# Patient Record
Sex: Male | Born: 1970 | Hispanic: No | Marital: Married | Smoking: Never smoker
Health system: Southern US, Community
[De-identification: ages and names within clinical notes are randomized; demographics above are authoritative.]

## PROBLEM LIST (undated history)

## (undated) DIAGNOSIS — A159 Respiratory tuberculosis unspecified: Secondary | ICD-10-CM

## (undated) HISTORY — DX: Respiratory tuberculosis unspecified: A15.9

---

## 2003-02-04 ENCOUNTER — Encounter: Admission: RE | Admit: 2003-02-04 | Discharge: 2003-02-04 | Payer: Self-pay | Admitting: Chiropractic Medicine

## 2005-12-31 ENCOUNTER — Ambulatory Visit: Payer: Self-pay | Admitting: General Practice

## 2014-06-04 ENCOUNTER — Ambulatory Visit (INDEPENDENT_AMBULATORY_CARE_PROVIDER_SITE_OTHER): Payer: BLUE CROSS/BLUE SHIELD

## 2014-06-04 ENCOUNTER — Ambulatory Visit (INDEPENDENT_AMBULATORY_CARE_PROVIDER_SITE_OTHER): Payer: BLUE CROSS/BLUE SHIELD | Admitting: Family Medicine

## 2014-06-04 VITALS — BP 100/70 | HR 99 | Temp 100.0°F | Ht 65.5 in | Wt 148.4 lb

## 2014-06-04 DIAGNOSIS — J329 Chronic sinusitis, unspecified: Secondary | ICD-10-CM | POA: Diagnosis not present

## 2014-06-04 DIAGNOSIS — J8410 Pulmonary fibrosis, unspecified: Secondary | ICD-10-CM | POA: Diagnosis not present

## 2014-06-04 DIAGNOSIS — R05 Cough: Secondary | ICD-10-CM

## 2014-06-04 DIAGNOSIS — R509 Fever, unspecified: Secondary | ICD-10-CM | POA: Diagnosis not present

## 2014-06-04 DIAGNOSIS — R059 Cough, unspecified: Secondary | ICD-10-CM

## 2014-06-04 LAB — POCT INFLUENZA A/B
Influenza A, POC: NEGATIVE
Influenza B, POC: NEGATIVE

## 2014-06-04 MED ORDER — AZITHROMYCIN 250 MG PO TABS: ORAL_TABLET | ORAL | Status: DC

## 2014-06-04 NOTE — Patient Instructions (Signed)
Take the Azithromycin 2 tabs the first day then 1 tab po daily x 4 days.  It will be 5 days total.  You should start feeling better in a few days.  If not, come back and see us.  If you're feeling worse, come back sooner.  It was good to meet you today.

## 2014-06-04 NOTE — Progress Notes (Addendum)
Nathaniel Blevins is a 44 y.o. male who presents to Urgent Care today with complaints of fever and cough and rhinorrhea.  1.  Fever and cough:  Biggest symptom is sinus pressure and congestion.  Also with rhinorrhea.  Clear drainage, occasionally purulent.  Dry cough, worse at night.  Fevers and chills, Fever measured at 103 at work today when he went to see nurse.  Sent home and recommended to come to be evaluated.    No nausea, eating and drinking well.  No chest pain.   Did not receive flu shot this year.   PMH reviewed.  Past Medical History  Diagnosis Date  . Tuberculosis    No past surgical history on file.  Medications reviewed. Current Outpatient Prescriptions  Medication Sig Dispense Refill  . ibuprofen (ADVIL,MOTRIN) 200 MG tablet Take 200 mg by mouth daily as needed.     No current facility-administered medications for this visit.    ROS as above otherwise neg.  No chest pain, palpitations, SOB, Fever, Chills, Abd pain, N/V/D.   Physical Exam:  BP 100/70 mmHg  Pulse 99  Temp(Src) 100 F (37.8 C) (Oral)  Ht 5' 5.5" (1.664 m)  Wt 148 lb 6 oz (67.302 kg)  BMI 24.31 kg/m2  SpO2 94% Gen:  Patient sitting on exam table, appears stated age in no acute distress Head: Normocephalic atraumatic Eyes: EOMI, PERRL, sclera and conjunctiva non-erythematous Ears:  Canals clear bilaterally.  TMs pearly gray bilaterally without erythema or bulging.   Nose:  Nasal turbinates grossly enlarged bilaterally. Some exudates noted. Tender to palpation of maxillary sinus  Mouth: Mucosa membranes moist. Tonsils +2, nonenlarged, non-erythematous. Neck: No cervical lymphadenopathy noted Heart:  RRR, no murmurs auscultated. Pulm:  Clear to auscultation bilaterally with good air movement.  No wheezes or rales noted.     [facility] reading (PRIMARY) by  Dr. Gwendolyn GrantWalden:  Odd xray, with tracheal deviation to Left.  No evidence of acute cardiopulmonary disease.  Does have small LEFT pleural  effusion.   Assessment and Plan:  1.  Sinus infection: - Treat with Azithromycin x 5 days - Negative CXR for CAP.  Negative flu swab. - If not improvement in 3-4 days, should FU with us - FU sooner if worsening.   - Should be better within next 3-4 days.  If no better, return.  Sooner if worsening.    **Addendum:  Please see CXR radiology read.  Needs CT of chest.  Concern for fibrosis versus malignancy.  Will put in order now.  Have instructed staff to schedule CT and have patient return in 2-3 days to assess for improvement and next steps pending CT chest.  If fever still present despite abx, may need to think of TB or other infectious causes.

## 2014-06-05 NOTE — Addendum Note (Signed)
Addended by: Tobey GrimWALDEN, Trayson Stitely H on: 06/05/2014 09:54 AM   Modules accepted: Orders, SmartSet

## 2014-06-05 NOTE — Addendum Note (Signed)
Addended byGwendolyn Grant: Jaevian Shean H on: 06/05/2014 07:51 AM   Modules accepted: Kipp BroodSmartSet

## 2014-06-09 ENCOUNTER — Ambulatory Visit (INDEPENDENT_AMBULATORY_CARE_PROVIDER_SITE_OTHER): Payer: BLUE CROSS/BLUE SHIELD | Admitting: Physician Assistant

## 2014-06-09 ENCOUNTER — Ambulatory Visit: Payer: BLUE CROSS/BLUE SHIELD

## 2014-06-09 VITALS — BP 110/66 | HR 82 | Temp 97.9°F | Resp 18 | Ht 65.0 in | Wt 143.0 lb

## 2014-06-09 DIAGNOSIS — Z8611 Personal history of tuberculosis: Secondary | ICD-10-CM | POA: Diagnosis not present

## 2014-06-09 DIAGNOSIS — R918 Other nonspecific abnormal finding of lung field: Secondary | ICD-10-CM | POA: Diagnosis not present

## 2014-06-09 DIAGNOSIS — R059 Cough, unspecified: Secondary | ICD-10-CM

## 2014-06-09 DIAGNOSIS — D509 Iron deficiency anemia, unspecified: Secondary | ICD-10-CM

## 2014-06-09 DIAGNOSIS — R05 Cough: Secondary | ICD-10-CM | POA: Diagnosis not present

## 2014-06-09 DIAGNOSIS — R938 Abnormal findings on diagnostic imaging of other specified body structures: Secondary | ICD-10-CM

## 2014-06-09 LAB — POCT CBC
Granulocyte percent: 61.4 %G (ref 37–80)
HCT, POC: 41.4 % — AB (ref 43.5–53.7)
Hemoglobin: 12.8 g/dL — AB (ref 14.1–18.1)
Lymph, poc: 3.1 (ref 0.6–3.4)
MCH, POC: 20.5 pg — AB (ref 27–31.2)
MCHC: 30.9 g/dL — AB (ref 31.8–35.4)
MCV: 66.4 fL — AB (ref 80–97)
MID (cbc): 0.4 (ref 0–0.9)
MPV: 7.6 fL (ref 0–99.8)
POC Granulocyte: 5.5 (ref 2–6.9)
POC LYMPH PERCENT: 34.1 %L (ref 10–50)
POC MID %: 4.5 %M (ref 0–12)
Platelet Count, POC: 410 10*3/uL (ref 142–424)
RBC: 6.24 M/uL — AB (ref 4.69–6.13)
RDW, POC: 15.7 %
WBC: 9 10*3/uL (ref 4.6–10.2)

## 2014-06-09 MED ORDER — FERROUS SULFATE 325 (65 FE) MG PO TABS: 325.0000 mg | ORAL_TABLET | Freq: Two times a day (BID) | ORAL | Status: DC

## 2014-06-09 MED ORDER — HYDROCODONE-HOMATROPINE 5-1.5 MG/5ML PO SYRP: 5.0000 mL | ORAL_SOLUTION | Freq: Three times a day (TID) | ORAL | Status: DC | PRN

## 2014-06-10 ENCOUNTER — Telehealth: Payer: Self-pay | Admitting: Radiology

## 2014-06-10 ENCOUNTER — Ambulatory Visit
Admission: RE | Admit: 2014-06-10 | Discharge: 2014-06-10 | Disposition: A | Discharge status: Home / Self Care | Payer: BLUE CROSS/BLUE SHIELD | Source: Ambulatory Visit | Attending: Physician Assistant | Admitting: Physician Assistant

## 2014-06-10 DIAGNOSIS — R918 Other nonspecific abnormal finding of lung field: Secondary | ICD-10-CM | POA: Insufficient documentation

## 2014-06-10 DIAGNOSIS — D509 Iron deficiency anemia, unspecified: Secondary | ICD-10-CM

## 2014-06-10 DIAGNOSIS — R059 Cough, unspecified: Secondary | ICD-10-CM

## 2014-06-10 DIAGNOSIS — R05 Cough: Secondary | ICD-10-CM

## 2014-06-10 DIAGNOSIS — Z8611 Personal history of tuberculosis: Secondary | ICD-10-CM | POA: Insufficient documentation

## 2014-06-10 LAB — COMPREHENSIVE METABOLIC PANEL
ALT: 31 U/L (ref 0–53)
AST: 22 U/L (ref 0–37)
Albumin: 4.3 g/dL (ref 3.5–5.2)
Alkaline Phosphatase: 61 U/L (ref 39–117)
BUN: 20 mg/dL (ref 6–23)
CO2: 27 mEq/L (ref 19–32)
Calcium: 9.3 mg/dL (ref 8.4–10.5)
Chloride: 102 mEq/L (ref 96–112)
Creat: 0.79 mg/dL (ref 0.50–1.35)
Glucose, Bld: 92 mg/dL (ref 70–99)
Potassium: 4.5 mEq/L (ref 3.5–5.3)
Sodium: 137 mEq/L (ref 135–145)
Total Bilirubin: 1.1 mg/dL (ref 0.2–1.2)
Total Protein: 7.7 g/dL (ref 6.0–8.3)

## 2014-06-10 MED ORDER — IOHEXOL 300 MG/ML  SOLN
75.0000 mL | Freq: Once | INTRAMUSCULAR | Status: AC | PRN
  Administered 2014-06-10: 75 mL via INTRAVENOUS

## 2014-06-10 NOTE — Telephone Encounter (Signed)
[facility] Called pt's wife and gave her information.

## 2014-06-10 NOTE — Progress Notes (Signed)
06/10/2014 at 10:18 AM  Nathaniel Blevins / DOB: 10-12-1970 / MRN: [id number]  The patient  does not have a problem list on file.  SUBJECTIVE  Chief complaint: Follow-up  Nathaniel Blevins is a 44 y.o. asian [place] male here today for follow up.  He was placed on Z-pak five days ago for a sinus infection and is now complaining of lingering cough that is worse at night.  He received a chest radiograph during his evaluation 5 days ago and was told by [facility] staff to return to discuss the results.    Results of chest radiograph discussed with patient.  Patient has a history of tuberculosis and was told in the past that he has "some scarring" in his lungs. 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  He does not wear a mask during his work.  He is a never smoker. He reports DOE that started during this most recent cough.  He denies chest pain, pressure, nausea, diaphoresis, and presyncope with the DOE.  He is ammenable to proceeding with the chest CT to further define the new nodules found on radiograph. He denies weight loss.    He  has a past medical history of Tuberculosis.    Medications reviewed and updated by myself where necessary, and exist elsewhere in the encounter.   Nathaniel Blevins has No Known Allergies. He  reports that he has never smoked. He does not have any smokeless tobacco history on file. He reports that he drinks alcohol. He reports that he does not use illicit drugs. He  has no sexual activity history on file. The patient  has no past surgical history on file.  His family history is not on file.  ROS  Per HPI  OBJECTIVE  His  height is  (1.651 m) and weight is 143 lb (64.864 kg). His oral temperature is 97.9 F (36.6 C). His blood pressure is 110/66 and his pulse is 82. His respiration is 18 and oxygen saturation is 92%.  The patient's body mass index is 23.8 kg/(m^2).  Physical Exam    Constitutional: He is oriented to person, place, and time. He appears well-developed and well-nourished. No distress.  HENT:  Right Ear: Hearing, tympanic membrane, external ear and ear canal normal.  Left Ear: Hearing, tympanic membrane, external ear and ear canal normal.  Nose: No sinus tenderness. Right sinus exhibits no maxillary sinus tenderness and no frontal sinus tenderness. Left sinus exhibits no maxillary sinus tenderness and no frontal sinus tenderness.  Mouth/Throat: Uvula is midline and mucous membranes are normal. Mucous membranes are not pale, not dry and not cyanotic. No oropharyngeal exudate, posterior oropharyngeal edema, posterior oropharyngeal erythema or tonsillar abscesses.  Cardiovascular: Normal rate and regular rhythm.   Respiratory: Effort normal and breath sounds normal. He has no wheezes. He has no rales.  GI: Soft. Bowel sounds are normal.  Musculoskeletal: Normal range of motion.  Lymphadenopathy:    He has no cervical adenopathy.  Neurological: He is alert and oriented to person, place, and time.  Skin: Skin is warm and dry. He is not diaphoretic.  Psychiatric: He has a normal mood and affect.    Results for orders placed or performed in visit on 06/09/14 (from the past 24 hour(s))  POCT CBC     Status: Abnormal   Collection Time: 06/09/14  9:16 PM  Result Value Ref Range  WBC 9.0 4.6 - 10.2 K/uL   Lymph, poc 3.1 0.6 - 3.4   POC LYMPH PERCENT 34.1 10 - 50 %L   MID (cbc) 0.4 0 - 0.9   POC MID % 4.5 0 - 12 %M   POC Granulocyte 5.5 2 - 6.9   Granulocyte percent 61.4 37 - 80 %G   RBC 6.24 (A) 4.69 - 6.13 M/uL   Hemoglobin 12.8 (A) 14.1 - 18.1 g/dL   HCT, POC 09.841.4 (A) 11.943.5 - 53.7 %   MCV 66.4 (A) 80 - 97 fL   MCH, POC 20.5 (A) 27 - 31.2 pg   MCHC 30.9 (A) 31.8 - 35.4 g/dL   RDW, POC 14.715.7 %   Platelet Count, POC 410 142 - 424 K/uL   MPV 7.6 0 - 99.8 fL    ASSESSMENT & PLAN  [name] was seen today for follow-up.  Diagnoses and all orders for this  visit:  Abnormal chest x-ray with multiple lung nodules: Patient with new nodules and will proceed with chest CT with contrast per radiologist recs.   Patient also with microcytic anemia on CBC.  He denies weight loss but has lost roughly 5 lbs since his initial visit 5 days ago, further causing concern for malignancy.   Orders: -     Comprehensive metabolic panel -     POCT CBC -     CT Chest W Contrast; Future  Coughing Orders: -     CT Chest W Contrast; Future       -     HYDROcodone-homatropine (HYCODAN) 5-1.5 MG/5ML syrup; Take 5 mLs by            mouth every 8 (eight) hours as needed for cough.  Microcytic anemia Orders: -     ferrous sulfate (FERROUSUL) 325 (65 FE) MG tablet; Take 1 tablet (325 mg total) by mouth 2 (two) times daily with a meal. -     CT Chest W Contrast; Future       The patient was advised to call or come back to clinic if he does not see an improvement in symptoms, or worsens with the above plan.   Deliah BostonMichael Ronin Crager, MHS, PA-C [facility] 06/10/2014 10:18 AM

## 2014-06-11 ENCOUNTER — Telehealth: Payer: Self-pay | Admitting: Physician Assistant

## 2014-06-11 DIAGNOSIS — Z8611 Personal history of tuberculosis: Secondary | ICD-10-CM

## 2014-06-11 DIAGNOSIS — Z8701 Personal history of pneumonia (recurrent): Secondary | ICD-10-CM

## 2014-06-11 NOTE — Telephone Encounter (Signed)
Spoke with pts wife regarding negative CT scan.  Per her report patient has been using cough syrup for the last two night and has told her that he "feels much better."  Advised her that if he does not continue to improve to contact me or RTC.  Advised that given his history of TB and this most recent bout of likely pneumonia I would like for him to establish care with a pulmonologist to ensure that he he is receiving the best lung care possible.  Patient wife agreed to this.  I will refer.  Deliah BostonMichael Clark, MS, PA-C   8:56 PM, 06/11/2014

## 2014-07-08 ENCOUNTER — Institutional Professional Consult (permissible substitution): Payer: BLUE CROSS/BLUE SHIELD | Admitting: Internal Medicine

## 2014-07-14 ENCOUNTER — Institutional Professional Consult (permissible substitution): Payer: BLUE CROSS/BLUE SHIELD | Admitting: Internal Medicine

## 2015-02-21 ENCOUNTER — Ambulatory Visit (INDEPENDENT_AMBULATORY_CARE_PROVIDER_SITE_OTHER): Payer: BLUE CROSS/BLUE SHIELD | Admitting: Internal Medicine

## 2015-02-21 VITALS — BP 112/68 | HR 88 | Temp 98.5°F | Resp 16 | Ht 65.0 in | Wt 147.0 lb

## 2015-02-21 DIAGNOSIS — B9789 Other viral agents as the cause of diseases classified elsewhere: Principal | ICD-10-CM

## 2015-02-21 DIAGNOSIS — J069 Acute upper respiratory infection, unspecified: Secondary | ICD-10-CM | POA: Diagnosis not present

## 2015-02-21 MED ORDER — BENZONATATE 100 MG PO CAPS: 100.0000 mg | ORAL_CAPSULE | Freq: Two times a day (BID) | ORAL | Status: AC | PRN

## 2015-02-21 MED ORDER — HYDROCODONE-HOMATROPINE 5-1.5 MG/5ML PO SYRP: 5.0000 mL | ORAL_SOLUTION | Freq: Four times a day (QID) | ORAL | Status: AC | PRN

## 2015-02-21 NOTE — Progress Notes (Signed)
   Subjective:  By signing my name below, I, Stann Oresung-Kai Tsai, attest that this documentation has been prepared under the direction and in the presence of Ellamae Siaobert Armiyah Capron, MD. Electronically Signed: Stann Oresung-Kai Tsai, Scribe. 02/21/2015 , 11:35 AM .  Patient was seen in Room 3 .   Patient ID: Nathaniel Blevins, male    DOB: 03/30/1970, 45 y.o.   MRN: [id number] Chief Complaint  Patient presents with  . Cough    x 5 days  . OTHER    pt. cant get any sleep   . Sore Throat    dry and itchy    HPI Nathaniel Blevins is a 45 y.o. male who [facility] of cough with sore throat that started 5 days ago. He notes that the coughs keep him up at night and it's difficult for him to have good air movement. He had a fever 4-5 days ago, but this has resolved. He denies rhinorrhea.   Patient Active Problem List   Diagnosis Date Noted  . Lung nodules 06/10/2014  . History of tuberculosis 06/10/2014    Current outpatient prescriptions:  .  ibuprofen (ADVIL,MOTRIN) 200 MG tablet, Take 200 mg by mouth daily as needed., Disp: , Rfl:   Review of Systems  Constitutional: Positive for fatigue. Negative for fever and chills.  HENT: Positive for sore throat. Negative for congestion, rhinorrhea, sinus pressure and sneezing.   Respiratory: Positive for cough. Negative for chest tightness, shortness of breath and wheezing.   Gastrointestinal: Negative for nausea and abdominal pain.      Objective:   Physical Exam  Constitutional: He is oriented to person, place, and time. He appears well-developed and well-nourished. No distress.  HENT:  Head: Normocephalic and atraumatic.  Nose: Rhinorrhea (clear) present.  Mouth/Throat: Oropharynx is clear and moist.  Eyes: EOM are normal. Pupils are equal, round, and reactive to light.  Neck: Neck supple.  Cardiovascular: Normal rate.   Pulmonary/Chest: Effort normal and breath sounds normal. No respiratory distress. He has no wheezes. He has no rales.  Musculoskeletal:  Normal range of motion.  Lymphadenopathy:    He has no cervical adenopathy.  Neurological: He is alert and oriented to person, place, and time.  Skin: Skin is warm and dry.  Psychiatric: He has a normal mood and affect. His behavior is normal.  Nursing note and vitals reviewed.   BP 112/68 mmHg  Pulse 88  Temp(Src) 98.5 F (36.9 C) (Oral)  Resp 16  Ht 5\' 5"  (1.651 m)  Wt 147 lb (66.679 kg)  BMI 24.46 kg/m2  SpO2 98%     Assessment & Plan:  Viral URI with cough Meds ordered this encounter  Medications  . benzonatate (TESSALON) 100 MG capsule    Sig: Take 1 capsule (100 mg total) by mouth 2 (two) times daily as needed for cough.    Dispense:  20 capsule    Refill:  0  . HYDROcodone-homatropine (HYCODAN) 5-1.5 MG/5ML syrup    Sig: Take 5 mLs by mouth every 6 (six) hours as needed. Especially at bedtime.    Dispense:  120 mL    Refill:  0   F/u for fever

## 2016-08-23 IMAGING — CT CT CHEST W/ CM
2 of 3 series · 15 of 36 positions shown, 18 images · IV contrast (omnipaque)
Comparison: Chest x-ray 06/04/2014

CLINICAL DATA: Abnormal chest radiograph with deviation of the
trachea to the left. Rule out mass. History of TB 7995. Cough 2
weeks.

EXAM:
CT CHEST WITH CONTRAST
TECHNIQUE: Multidetector CT imaging of the chest was performed during
intravenous contrast administration.
CONTRAST:  75 mL Omnipaque 300 IV.

[Series 3: chest 5.0 i41s 3 · axial · 0.60mm/px · z∈[-361,-116]mm · 12 of 59 slices shown, 15 images]
[im 5/59  mediastinal]
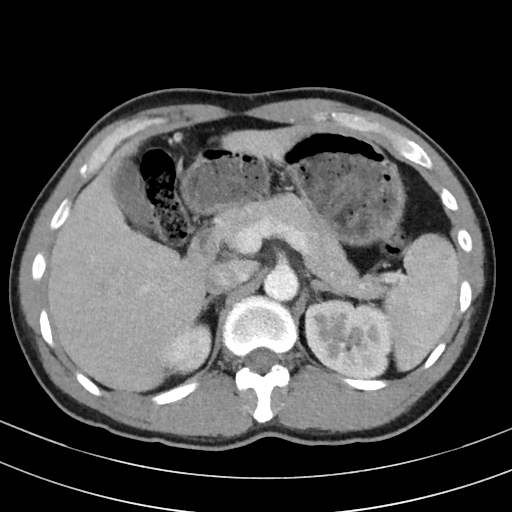
[im 5/59  lung]
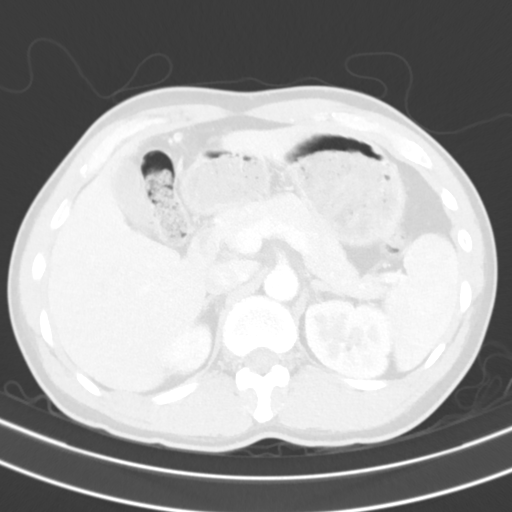
[im 9/59  lung]
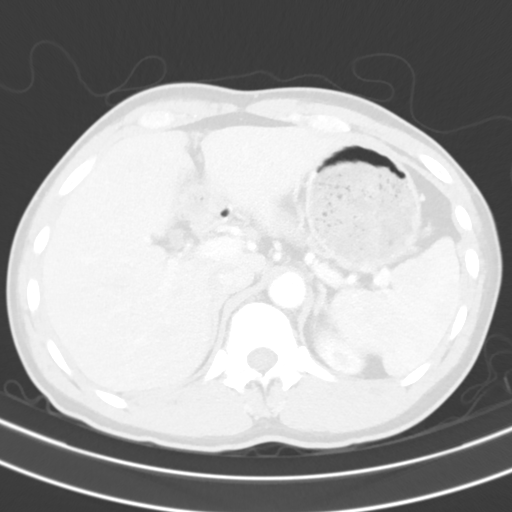
[im 13/59  lung]
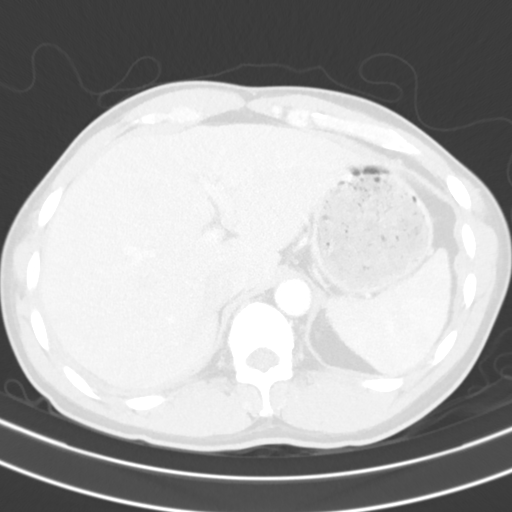
[im 18/59  lung]
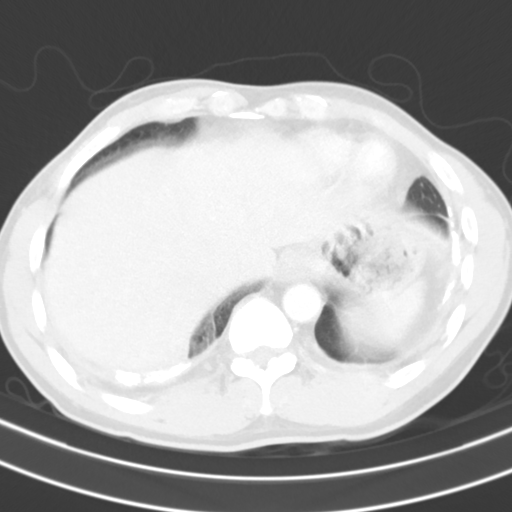
[im 22/59  mediastinal]
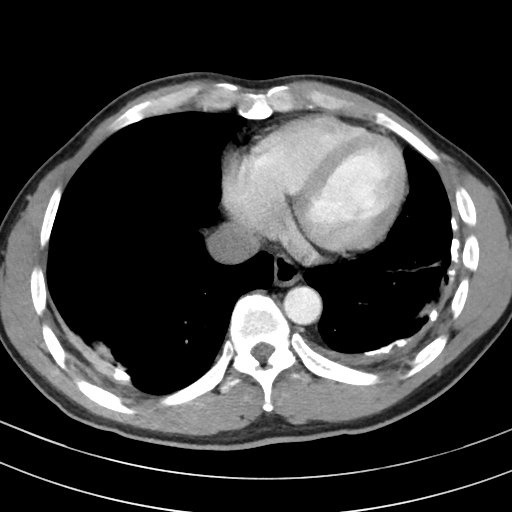
[im 22/59  lung]
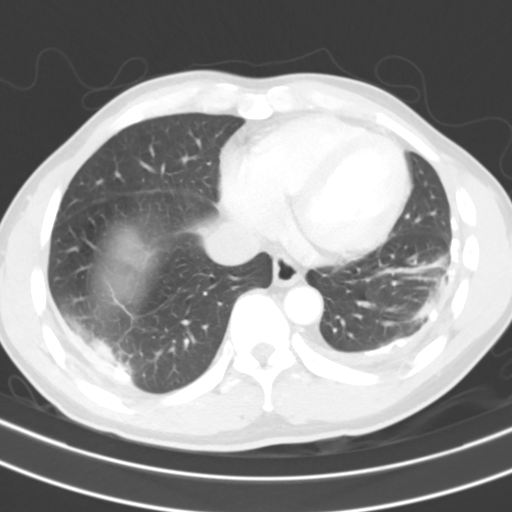
[im 26/59  lung]
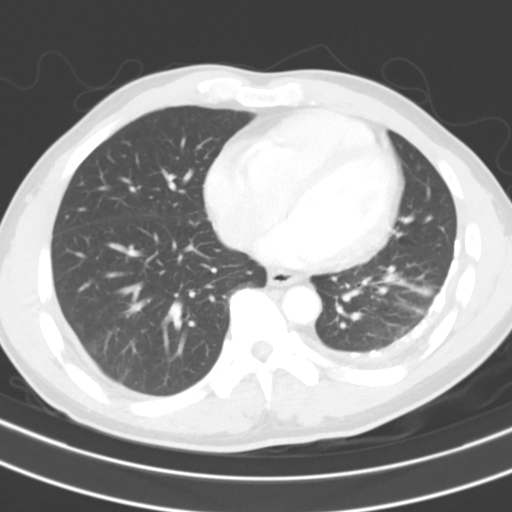
[im 33/59  lung]
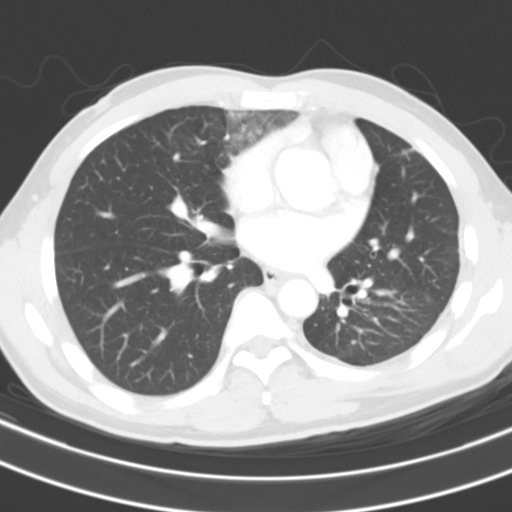
[im 37/59  lung]
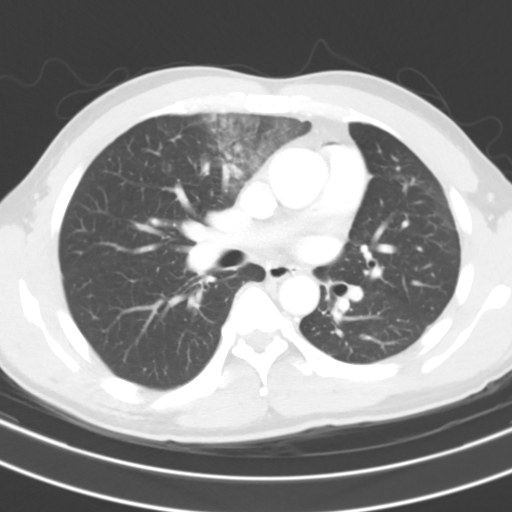
[im 41/59  mediastinal]
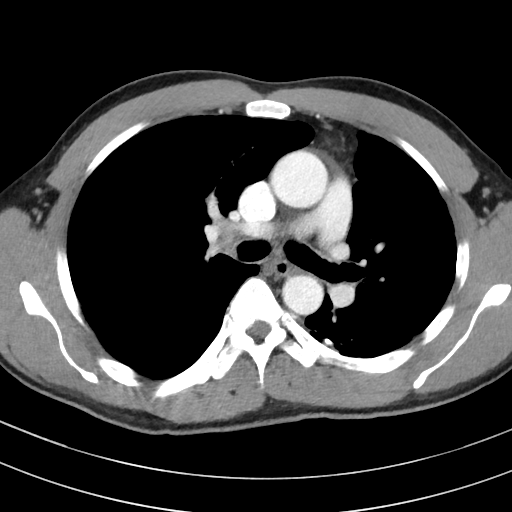
[im 41/59  lung]
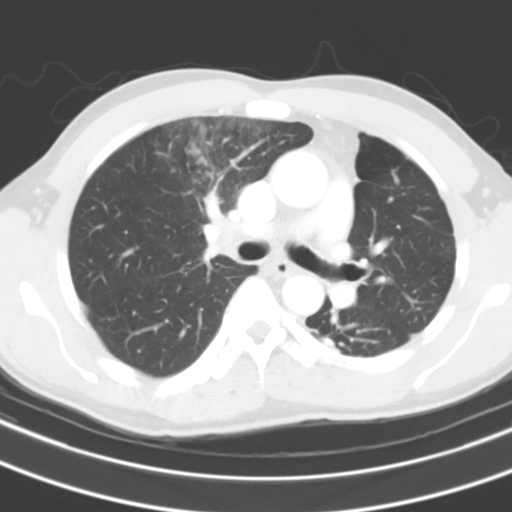
[im 46/59  lung]
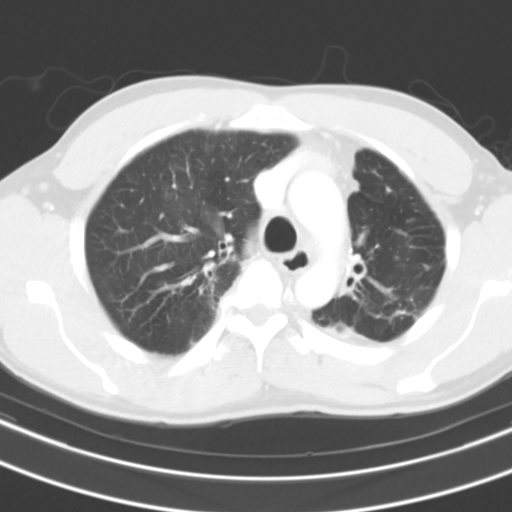
[im 50/59  lung]
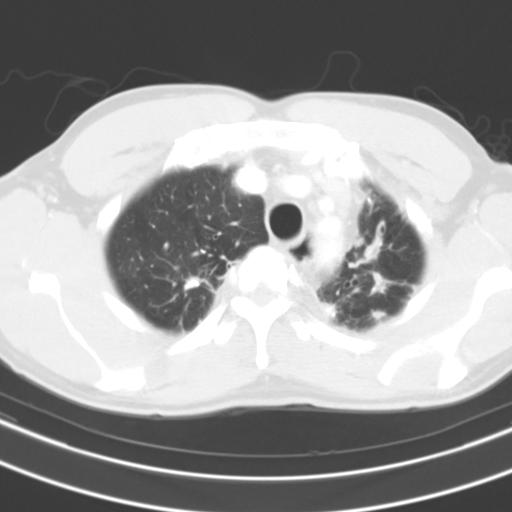
[im 54/59  lung]
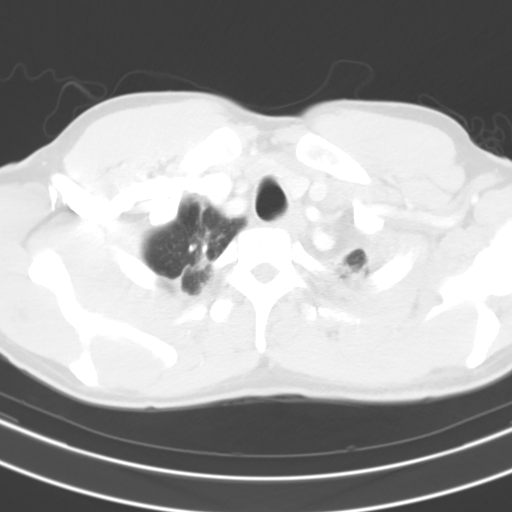

[Series 4: cor 2.0 spo · coronal · 0.59mm/px · 3 of 112 slices shown]
[im 23/112  lung]
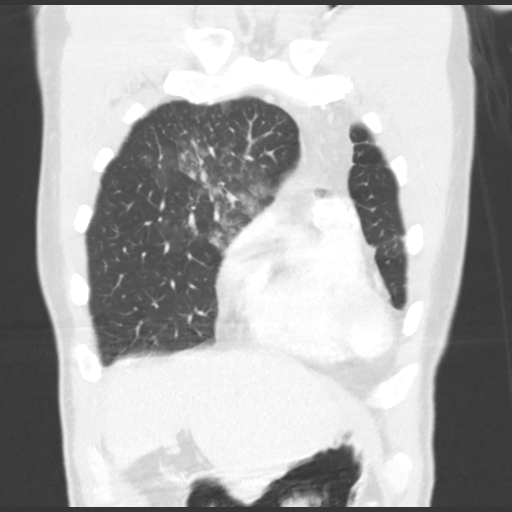
[im 45/112  lung]
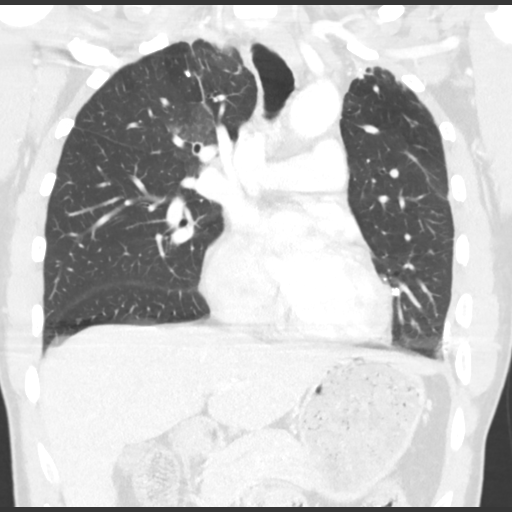
[im 67/112  lung]
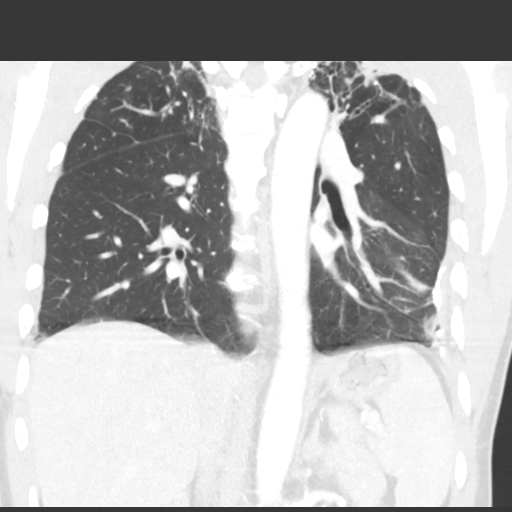

[15 of 36 positions shown; findings below may reference images not displayed]

FINDINGS: Lungs are adequately inflated with biapical pleural thickening and
apical scarring left worse than right with associated parenchymal
calcification likely sequelae from patient's previous tuberculosis
infection. There is linear scarring over the left lower lobe. There
is pleural thickening with pleural calcification over the posterior
right lung base and posterior lateral left base. There is a hazy
airspace process over the anterior inferior right upper lobe and
medial right middle lobe. Likely an acute process due to infection.
No significant pleural effusion. Airways are within normal.

Heart is normal size. Minimal prominence of the ascending thoracic
aorta measuring 3.3 cm. No significant hilar, mediastinal or
axillary adenopathy. Remaining mediastinal structures are within
normal.

Images through the upper abdomen are within normal.
IMPRESSION: Airspace process over the right middle and upper lobe likely a
pneumonia. No significant mediastinal or hilar adenopathy. Recommend
followup CT in 3-4 weeks post treatment to ensure resolution.

Chronic changes with scarring, parenchymal and pleural calcification
over the apices and lung bases likely sequelae from patient's prior
TB.

## 2018-01-03 DIAGNOSIS — M25511 Pain in right shoulder: Secondary | ICD-10-CM | POA: Diagnosis not present

## 2019-08-14 ENCOUNTER — Ambulatory Visit: Payer: BLUE CROSS/BLUE SHIELD | Attending: Internal Medicine

## 2019-08-14 DIAGNOSIS — Z23 Encounter for immunization: Secondary | ICD-10-CM

## 2019-08-14 NOTE — Progress Notes (Signed)
   Covid-19 Vaccination Clinic  Name:  Diyari Cherne    MRN: [id number] DOB: 22-Apr-1970  08/14/2019  Mr. Bee was observed post Covid-19 immunization for 15 minutes without incident. He was provided with Vaccine Information Sheet and instruction to access the V-Safe system.   Mr. Knauff was instructed to call 911 with any severe reactions post vaccine: Marland Kitchen Difficulty breathing  . Swelling of face and throat  . A fast heartbeat  . A bad rash all over body  . Dizziness and weakness   Immunizations Administered    Name Date Dose VIS Date Route   JANSSEN COVID-19 VACCINE 08/14/2019  4:53 PM 0.5 mL 04/19/2019 Intramuscular   Manufacturer: [employer]   Lot: [id number]   NDC: 32992-426-83
